# Patient Record
Sex: Female | Born: 1975 | Race: Black or African American | Hispanic: No | Marital: Married | Smoking: Former smoker
Health system: Southern US, Community
[De-identification: ages and names within clinical notes are randomized; demographics above are authoritative.]

## PROBLEM LIST (undated history)

## (undated) DIAGNOSIS — J189 Pneumonia, unspecified organism: Secondary | ICD-10-CM

## (undated) HISTORY — DX: Pneumonia, unspecified organism: J18.9

---

## 1998-01-04 ENCOUNTER — Emergency Department (HOSPITAL_COMMUNITY): Admission: EM | Admit: 1998-01-04 | Discharge: 1998-01-04 | Payer: Self-pay | Admitting: Emergency Medicine

## 1999-11-19 ENCOUNTER — Other Ambulatory Visit: Admission: RE | Admit: 1999-11-19 | Discharge: 1999-11-19 | Payer: Self-pay | Admitting: *Deleted

## 2000-01-21 ENCOUNTER — Inpatient Hospital Stay (HOSPITAL_COMMUNITY): Admission: AD | Admit: 2000-01-21 | Discharge: 2000-01-21 | Payer: Self-pay | Admitting: Obstetrics and Gynecology

## 2000-04-08 ENCOUNTER — Inpatient Hospital Stay (HOSPITAL_COMMUNITY): Admission: AD | Admit: 2000-04-08 | Discharge: 2000-04-11 | Payer: Self-pay | Admitting: Obstetrics and Gynecology

## 2000-11-16 ENCOUNTER — Encounter: Payer: Self-pay | Admitting: Emergency Medicine

## 2000-11-16 ENCOUNTER — Emergency Department (HOSPITAL_COMMUNITY): Admission: EM | Admit: 2000-11-16 | Discharge: 2000-11-16 | Payer: Self-pay | Admitting: Emergency Medicine

## 2005-12-16 ENCOUNTER — Ambulatory Visit (HOSPITAL_COMMUNITY): Admission: RE | Admit: 2005-12-16 | Discharge: 2005-12-16 | Payer: Self-pay | Admitting: Obstetrics & Gynecology

## 2005-12-24 ENCOUNTER — Inpatient Hospital Stay (HOSPITAL_COMMUNITY): Admission: AD | Admit: 2005-12-24 | Discharge: 2005-12-24 | Payer: Self-pay | Admitting: Obstetrics

## 2006-02-06 ENCOUNTER — Ambulatory Visit (HOSPITAL_COMMUNITY): Admission: RE | Admit: 2006-02-06 | Discharge: 2006-02-06 | Payer: Self-pay | Admitting: Obstetrics & Gynecology

## 2006-06-19 ENCOUNTER — Inpatient Hospital Stay (HOSPITAL_COMMUNITY): Admission: AD | Admit: 2006-06-19 | Discharge: 2006-06-22 | Payer: Self-pay | Admitting: Obstetrics & Gynecology

## 2010-03-12 ENCOUNTER — Emergency Department (HOSPITAL_COMMUNITY): Payer: No Typology Code available for payment source

## 2010-03-12 ENCOUNTER — Emergency Department (HOSPITAL_COMMUNITY)
Admission: EM | Admit: 2010-03-12 | Discharge: 2010-03-12 | Disposition: A | Discharge status: Home / Self Care | Payer: No Typology Code available for payment source | Attending: Emergency Medicine | Admitting: Emergency Medicine

## 2010-03-12 DIAGNOSIS — M25519 Pain in unspecified shoulder: Secondary | ICD-10-CM | POA: Insufficient documentation

## 2010-03-12 DIAGNOSIS — M25569 Pain in unspecified knee: Secondary | ICD-10-CM | POA: Insufficient documentation

## 2010-03-12 DIAGNOSIS — M542 Cervicalgia: Secondary | ICD-10-CM | POA: Insufficient documentation

## 2014-09-22 DIAGNOSIS — J189 Pneumonia, unspecified organism: Secondary | ICD-10-CM

## 2014-09-22 HISTORY — DX: Pneumonia, unspecified organism: J18.9

## 2014-10-10 ENCOUNTER — Emergency Department (HOSPITAL_COMMUNITY)
Admission: EM | Admit: 2014-10-10 | Discharge: 2014-10-10 | Disposition: A | Discharge status: Home / Self Care | Payer: BLUE CROSS/BLUE SHIELD | Attending: Emergency Medicine | Admitting: Emergency Medicine

## 2014-10-10 ENCOUNTER — Encounter (HOSPITAL_COMMUNITY): Payer: Self-pay | Admitting: Emergency Medicine

## 2014-10-10 ENCOUNTER — Emergency Department (HOSPITAL_COMMUNITY): Payer: BLUE CROSS/BLUE SHIELD

## 2014-10-10 DIAGNOSIS — Z87891 Personal history of nicotine dependence: Secondary | ICD-10-CM | POA: Insufficient documentation

## 2014-10-10 DIAGNOSIS — J159 Unspecified bacterial pneumonia: Secondary | ICD-10-CM | POA: Insufficient documentation

## 2014-10-10 DIAGNOSIS — J189 Pneumonia, unspecified organism: Secondary | ICD-10-CM

## 2014-10-10 DIAGNOSIS — Z3202 Encounter for pregnancy test, result negative: Secondary | ICD-10-CM | POA: Diagnosis not present

## 2014-10-10 DIAGNOSIS — R079 Chest pain, unspecified: Secondary | ICD-10-CM | POA: Diagnosis present

## 2014-10-10 LAB — BASIC METABOLIC PANEL
ANION GAP: 8 (ref 5–15)
BUN: 7 mg/dL (ref 6–20)
CALCIUM: 9.6 mg/dL (ref 8.9–10.3)
CO2: 25 mmol/L (ref 22–32)
CREATININE: 0.75 mg/dL (ref 0.44–1.00)
Chloride: 105 mmol/L (ref 101–111)
Glucose, Bld: 97 mg/dL (ref 65–99)
Potassium: 3.6 mmol/L (ref 3.5–5.1)
SODIUM: 138 mmol/L (ref 135–145)

## 2014-10-10 LAB — CBC
HCT: 39 % (ref 36.0–46.0)
HEMOGLOBIN: 13.5 g/dL (ref 12.0–15.0)
MCH: 30.5 pg (ref 26.0–34.0)
MCHC: 34.6 g/dL (ref 30.0–36.0)
MCV: 88.2 fL (ref 78.0–100.0)
PLATELETS: 348 10*3/uL (ref 150–400)
RBC: 4.42 MIL/uL (ref 3.87–5.11)
RDW: 12.5 % (ref 11.5–15.5)
WBC: 14.6 10*3/uL — ABNORMAL HIGH (ref 4.0–10.5)

## 2014-10-10 LAB — I-STAT BETA HCG BLOOD, ED (MC, WL, AP ONLY)

## 2014-10-10 LAB — I-STAT TROPONIN, ED: TROPONIN I, POC: 0 ng/mL (ref 0.00–0.08)

## 2014-10-10 LAB — TROPONIN I

## 2014-10-10 LAB — D-DIMER, QUANTITATIVE (NOT AT ARMC): D DIMER QUANT: 1.12 ug{FEU}/mL — AB (ref 0.00–0.48)

## 2014-10-10 MED ORDER — LEVOFLOXACIN 750 MG PO TABS
750.0000 mg | ORAL_TABLET | Freq: Once | ORAL | Status: AC
  Administered 2014-10-10: 750 mg via ORAL
  Filled 2014-10-10: qty 1

## 2014-10-10 MED ORDER — LEVOFLOXACIN 750 MG PO TABS: 750.0000 mg | ORAL_TABLET | Freq: Every day | ORAL | Status: AC

## 2014-10-10 MED ORDER — IOHEXOL 350 MG/ML SOLN
80.0000 mL | Freq: Once | INTRAVENOUS | Status: AC | PRN
  Administered 2014-10-10: 80 mL via INTRAVENOUS

## 2014-10-10 MED ORDER — KETOROLAC TROMETHAMINE 30 MG/ML IJ SOLN
30.0000 mg | Freq: Once | INTRAMUSCULAR | Status: AC
  Administered 2014-10-10: 30 mg via INTRAVENOUS
  Filled 2014-10-10: qty 1

## 2014-10-10 MED ORDER — SODIUM CHLORIDE 0.9 % IV BOLUS (SEPSIS)
1000.0000 mL | Freq: Once | INTRAVENOUS | Status: AC
  Administered 2014-10-10: 1000 mL via INTRAVENOUS

## 2014-10-10 NOTE — ED Notes (Signed)
Pt transporting to CT angio. NAD

## 2014-10-10 NOTE — ED Notes (Signed)
Patient states R chest pain and shortness of breath when she tries to breathe in.   Denies other symptoms.

## 2014-10-10 NOTE — ED Provider Notes (Signed)
CSN: [id number]     Arrival date & time 10/10/14  0906 History   First MD Initiated Contact with Patient 10/10/14 1037     Chief Complaint  Patient presents with  . Chest Pain  . Shortness of Breath     (Consider location/radiation/quality/duration/timing/severity/associated sxs/prior Treatment) HPI Comments: 39 y.o. Female with history of pneumonia presents for chest pain and shortness of breath.  The patient reports that since last night she has had sharp pain in her right chest that is worse with inspiration and seems to improve with expiration.  She denies fever, chills, nausea, vomiting.  She reports that about 2 weeks ago she did drive to and from [place].  Denies other immobility or surgery.  She is not on anticoagulation.  She reports that she did have similar symptoms with a previous episode of pneumonia.  Denies abdominal pain or pain with eating.    Patient is a 39 y.o. female presenting with chest pain and shortness of breath.  Chest Pain Associated symptoms: shortness of breath   Associated symptoms: no abdominal pain, no back pain, no cough, no dizziness, no fatigue, no fever, no headache, no nausea, no palpitations and not vomiting   Shortness of Breath Associated symptoms: chest pain   Associated symptoms: no abdominal pain, no cough, no fever, no headaches, no rash and no vomiting     History reviewed. No pertinent past medical history. History reviewed. No pertinent past surgical history. No family history on file. Social History  Substance Use Topics  . Smoking status: Former Games developer  . Smokeless tobacco: None  . Alcohol Use: Yes     Comment: socially   OB History    No data available     Review of Systems  Constitutional: Negative for fever, chills, appetite change and fatigue.  HENT: Negative for congestion, postnasal drip and rhinorrhea.   Eyes: Negative for pain and redness.  Respiratory: Positive for shortness of breath. Negative for cough and chest  tightness.   Cardiovascular: Positive for chest pain. Negative for palpitations and leg swelling.  Gastrointestinal: Negative for nausea, vomiting, abdominal pain and diarrhea.  Genitourinary: Negative for dysuria, urgency and hematuria.  Musculoskeletal: Negative for myalgias and back pain.  Skin: Negative for rash.  Neurological: Negative for dizziness, light-headedness and headaches.  Hematological: Does not bruise/bleed easily.      Allergies  Review of patient's allergies indicates no known allergies.  Home Medications   Prior to Admission medications   Medication Sig Start Date End Date Taking? Authorizing Provider  levofloxacin (LEVAQUIN) 750 MG tablet Take 1 tablet (750 mg total) by mouth daily. 10/10/14   Leta Baptist, MD   BP 129/84 mmHg  Pulse 99  Temp(Src) 98.4 F (36.9 C) (Oral)  Resp 23  Ht  (1.575 m)  Wt 205 lb 9 oz (93.243 kg)  BMI 37.59 kg/m2  SpO2 98%  LMP 09/30/2014 Physical Exam  Constitutional: She is oriented to person, place, and time. She appears well-developed and well-nourished. No distress.  HENT:  Head: Normocephalic and atraumatic.  Right Ear: External ear normal.  Left Ear: External ear normal.  Nose: Nose normal.  Mouth/Throat: Oropharynx is clear and moist. No oropharyngeal exudate.  Eyes: EOM are normal. Pupils are equal, round, and reactive to light.  Neck: Normal range of motion. Neck supple.  Cardiovascular: Normal rate, regular rhythm, normal heart sounds and intact distal pulses.   No murmur heard. Pulmonary/Chest: Effort normal. No respiratory distress. She has no wheezes. She  has no rales.  Abdominal: Soft. She exhibits no distension. There is no tenderness.  Musculoskeletal: Normal range of motion. She exhibits no edema or tenderness.  Neurological: She is alert and oriented to person, place, and time.  Skin: Skin is warm and dry. No rash noted. She is not diaphoretic.  Vitals reviewed.   ED Course  Procedures  (including critical care time) Labs Review Labs Reviewed  CBC - Abnormal; Notable for the following:    WBC 14.6 (*)    All other components within normal limits  D-DIMER, QUANTITATIVE (NOT AT [facility]) - Abnormal; Notable for the following:    D-Dimer, Quant 1.12 (*)    All other components within normal limits  BASIC METABOLIC PANEL  TROPONIN I  I-STAT BETA HCG BLOOD, ED ([facility], [facility], [facility] ONLY)  I-STAT TROPOININ, ED    Imaging Review Dg Chest 2 View  10/10/2014   CLINICAL DATA:  Right upper chest pain since yesterday. Low-grade fever, shortness of breath.  EXAM: CHEST  2 VIEW  COMPARISON:  None.  FINDINGS: There bibasilar opacities compatible with atelectasis. Small effusions. Heart is normal size. No acute bony abnormality.  IMPRESSION: Small bilateral effusions with bibasilar atelectasis.   Electronically Signed   By: Charlett Nose M.D.   On: 10/10/2014 10:45   Ct Angio Chest Pe W/cm &/or Wo Cm  10/10/2014   CLINICAL DATA:  Right-sided chest pain, shortness of breath since yesterday morning. Pain increases with deep inspiration.  EXAM: CT ANGIOGRAPHY CHEST WITH CONTRAST  TECHNIQUE: Multidetector CT imaging of the chest was performed using the standard protocol during bolus administration of intravenous contrast. Multiplanar CT image reconstructions and MIPs were obtained to evaluate the vascular anatomy.  CONTRAST:  80mL OMNIPAQUE IOHEXOL 350 MG/ML SOLN  COMPARISON:  10/10/2014  FINDINGS: No filling defects in the pulmonary arteries to suggest pulmonary emboli. There is a small right pleural effusion. Right lower lobe atelectasis or pneumonia noted. Less pronounced left basilar opacity could reflect atelectasis or pneumonia as well. No mediastinal, hilar, or axillary adenopathy. Heart is normal size. Aorta is normal caliber. Chest wall soft tissues are unremarkable. Imaging into the upper abdomen shows no acute findings.  Review of the MIP images confirms the above findings.  IMPRESSION: No evidence of  pulmonary embolus.  Bilateral lower lobe airspace opacities, right worse than left which could reflect atelectasis or pneumonia.  Small right pleural effusion.   Electronically Signed   By: Charlett Nose M.D.   On: 10/10/2014 14:28   I have personally reviewed and evaluated these images and lab results as part of my medical decision-making.   EKG Interpretation   Date/Time:  Monday October 10 2014 10:29:47 EDT Ventricular Rate:  91 PR Interval:  140 QRS Duration: 82 QT Interval:  338 QTC Calculation: 415 R Axis:   86 Text Interpretation:  Normal sinus rhythm Normal ECG No previous tracing  Confirmed by Marlys Stegmaier ([id number]) on 10/10/2014 1:55:13 PM      MDM  Patient was seen and evaluated in stable condition.  Concern for PE.  Elevated D dimer and leukocytosis noted but otherwise unremarkable laboratory studies.  Chest xray with bibasilar effusion.  CT angio without PE but with lower lobe airspace opacities.  With symptoms and leukocytosis appeared most consistent with pneumonia.  Patient started on Levaquin.  Results, clinical impression, and plan of care discussed with patient who expressed understanding and agreement.  Patient was discharged home in stable condition with instruction to follow up with her PCP.  Final diagnoses:  CAP (community acquired pneumonia)    1. Community Acquired Pneumonia    Leta Baptist, MD 10/10/14 2252

## 2014-10-10 NOTE — Discharge Instructions (Signed)

## 2014-10-14 ENCOUNTER — Ambulatory Visit: Payer: BLUE CROSS/BLUE SHIELD | Attending: Physician Assistant | Admitting: Physician Assistant

## 2014-10-14 VITALS — BP 106/77 | HR 109 | Temp 98.5°F | Resp 18 | Ht 62.0 in | Wt 207.0 lb

## 2014-10-14 DIAGNOSIS — J189 Pneumonia, unspecified organism: Secondary | ICD-10-CM | POA: Diagnosis present

## 2014-10-14 NOTE — Progress Notes (Signed)
   Rebecca Baldwin  ZOX:[id number]  [id number]  DOB - 1975/02/19  Chief Complaint  Patient presents with  . Hospitalization Follow-up       Subjective:   Rebecca Baldwin is a 39 y.o. female here today for establishment of care. She was in the emergency department on 10/10/2014 with complaints of right-sided chest pain, shortness of breath, and difficulty taking a deep breath. Her sensation was sharp. No accompanying fever, chills nausea, or vomiting. It is been going on for a couple of days. She states she felt like this prior when having a bout with pneumonia. Her white blood cell count was 14,000. A CTPA was negative for pulmonary emboli but did show some opacities in the right lower lobe. She was started on Levaquin.  Since discharge she's feeling a little better. Her cough is lessened. She still quite sore on the right side especially when she takes a deep breath. Her sputum has cleared. She is able to sleep.    ROS: GEN: denies fever or chills, denies change in weight Skin: denies lesions or rashes HEENT: denies headache, earache, epistaxis, sore throat, or neck pain LUNGS: + SHOB, +dyspnea, PND, orthopnea CV: denies CP or palpitations   Problem  Cap (Community Acquired Pneumonia)    ALLERGIES: No Known Allergies  PAST MEDICAL HISTORY: Past Medical History  Diagnosis Date  . Pneumonia 09/2014    PAST SURGICAL HISTORY: Past Surgical History  Procedure Laterality Date  . Cesarean section      MEDICATIONS AT HOME: Prior to Admission medications   Medication Sig Start Date End Date Taking? Authorizing Provider  levofloxacin (LEVAQUIN) 750 MG tablet Take 1 tablet (750 mg total) by mouth daily. 10/10/14   Leta Baptist, MD     Objective:   Filed Vitals:   10/14/14 1014  BP: 106/77  Pulse: 109  Temp: 98.5 F (36.9 C)  TempSrc: Oral  Resp: 18  Height:  (1.575 m)  Weight: 207 lb (93.895 kg)  SpO2: 98%    Exam General appearance : Awake,  alert, not in any distress. Speech Clear. Not toxic looking HEENT: Atraumatic and Normocephalic, pupils equally reactive to light and accomodation Neck: supple, no JVD. No cervical lymphadenopathy.  Chest:Good air entry bilaterally, no added sounds  CVS: S1 S2 regular, no murmurs.    Assessment & Plan  1. Community-acquired pneumonia  -Complete the full 7 day course of Levaquin  -If you have not had total resolution of his symptoms over the next 10 days please give [place] a call  back    Return in about 3 months (around 01/13/2015). Routine health maintenance, gynecological exam etc.  The patient was given clear instructions to go to ER or return to medical center if symptoms don't improve, worsen or new problems develop. The patient verbalized understanding. The patient was told to call to get lab results if they haven't heard anything in the next week.   This note has been created with Education officer, environmental. Any transcriptional errors are unintentional.    Scot Jun, PA-C [facility] [place], [place] [phone]   10/14/2014, 11:21 AM

## 2014-10-14 NOTE — Progress Notes (Signed)
Patient in ED with pneumonia on Monday. Patient reports feeling much better, breathing is improving, patient still has pain in right rib area in the mornings with breathing. Patient reports sneezing a lot yesterday and took Benadryl.   Patient reports pain in right rib area currently at level 3, described as discomfort, with breathing.

## 2014-10-19 ENCOUNTER — Telehealth: Payer: Self-pay | Admitting: Physician Assistant

## 2014-10-19 NOTE — Telephone Encounter (Signed)
Patient called to request some advice on what to take for her cough. Please f/u with pt.

## 2014-11-04 NOTE — Telephone Encounter (Signed)
Nurse called patient, patient verified date of birth. Patient reports feeling well and has no needs at this time.

## 2015-06-10 ENCOUNTER — Encounter (HOSPITAL_COMMUNITY): Payer: Self-pay

## 2015-06-10 ENCOUNTER — Emergency Department (HOSPITAL_COMMUNITY)
Admission: EM | Admit: 2015-06-10 | Discharge: 2015-06-10 | Disposition: A | Discharge status: Home / Self Care | Payer: BLUE CROSS/BLUE SHIELD | Attending: Emergency Medicine | Admitting: Emergency Medicine

## 2015-06-10 DIAGNOSIS — Y92410 Unspecified street and highway as the place of occurrence of the external cause: Secondary | ICD-10-CM | POA: Insufficient documentation

## 2015-06-10 DIAGNOSIS — S4991XA Unspecified injury of right shoulder and upper arm, initial encounter: Secondary | ICD-10-CM | POA: Insufficient documentation

## 2015-06-10 DIAGNOSIS — Y9389 Activity, other specified: Secondary | ICD-10-CM | POA: Insufficient documentation

## 2015-06-10 DIAGNOSIS — Y998 Other external cause status: Secondary | ICD-10-CM | POA: Diagnosis not present

## 2015-06-10 DIAGNOSIS — S3992XA Unspecified injury of lower back, initial encounter: Secondary | ICD-10-CM | POA: Insufficient documentation

## 2015-06-10 DIAGNOSIS — Z79899 Other long term (current) drug therapy: Secondary | ICD-10-CM | POA: Diagnosis not present

## 2015-06-10 DIAGNOSIS — Z87891 Personal history of nicotine dependence: Secondary | ICD-10-CM | POA: Diagnosis not present

## 2015-06-10 DIAGNOSIS — Z8701 Personal history of pneumonia (recurrent): Secondary | ICD-10-CM | POA: Insufficient documentation

## 2015-06-10 MED ORDER — NAPROXEN 500 MG PO TABS: 500.0000 mg | ORAL_TABLET | Freq: Two times a day (BID) | ORAL | Status: AC

## 2015-06-10 MED ORDER — DIAZEPAM 5 MG PO TABS: 5.0000 mg | ORAL_TABLET | Freq: Two times a day (BID) | ORAL | Status: AC

## 2015-06-10 MED ORDER — NAPROXEN 250 MG PO TABS
500.0000 mg | ORAL_TABLET | Freq: Once | ORAL | Status: AC
  Administered 2015-06-10: 500 mg via ORAL
  Filled 2015-06-10: qty 2

## 2015-06-10 MED ORDER — DIAZEPAM 5 MG PO TABS
5.0000 mg | ORAL_TABLET | Freq: Once | ORAL | Status: AC
  Administered 2015-06-10: 5 mg via ORAL
  Filled 2015-06-10: qty 1
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
## 2015-06-10 NOTE — ED Notes (Signed)
Declined W/C at D/C and was escorted to lobby by RN. 
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
## 2015-08-05 ENCOUNTER — Emergency Department (HOSPITAL_COMMUNITY)
Admission: EM | Admit: 2015-08-05 | Discharge: 2015-08-05 | Disposition: A | Discharge status: Home / Self Care | Payer: No Typology Code available for payment source | Attending: Emergency Medicine | Admitting: Emergency Medicine

## 2015-08-05 ENCOUNTER — Encounter (HOSPITAL_COMMUNITY): Payer: Self-pay

## 2015-08-05 DIAGNOSIS — Z87891 Personal history of nicotine dependence: Secondary | ICD-10-CM | POA: Diagnosis not present

## 2015-08-05 DIAGNOSIS — Y939 Activity, unspecified: Secondary | ICD-10-CM | POA: Insufficient documentation

## 2015-08-05 DIAGNOSIS — Y92410 Unspecified street and highway as the place of occurrence of the external cause: Secondary | ICD-10-CM | POA: Insufficient documentation

## 2015-08-05 DIAGNOSIS — Y999 Unspecified external cause status: Secondary | ICD-10-CM | POA: Diagnosis not present

## 2015-08-05 DIAGNOSIS — M549 Dorsalgia, unspecified: Secondary | ICD-10-CM | POA: Insufficient documentation

## 2015-08-05 LAB — URINALYSIS, ROUTINE W REFLEX MICROSCOPIC
Bilirubin Urine: NEGATIVE
Glucose, UA: NEGATIVE mg/dL
Hgb urine dipstick: NEGATIVE
KETONES UR: NEGATIVE mg/dL
LEUKOCYTES UA: NEGATIVE
NITRITE: NEGATIVE
PROTEIN: NEGATIVE mg/dL
Specific Gravity, Urine: 1.016 (ref 1.005–1.030)
pH: 6 (ref 5.0–8.0)

## 2015-08-05 LAB — PREGNANCY, URINE: Preg Test, Ur: NEGATIVE

## 2015-08-05 MED ORDER — KETOROLAC TROMETHAMINE 60 MG/2ML IM SOLN
60.0000 mg | Freq: Once | INTRAMUSCULAR | Status: AC
  Administered 2015-08-05: 60 mg via INTRAMUSCULAR
  Filled 2015-08-05: qty 2

## 2015-08-05 MED ORDER — METOCLOPRAMIDE HCL 10 MG PO TABS
10.0000 mg | ORAL_TABLET | Freq: Once | ORAL | Status: AC
  Administered 2015-08-05: 10 mg via ORAL
  Filled 2015-08-05: qty 1
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
## 2016-08-26 IMAGING — CT CT ANGIO CHEST
1 of 8 series · 17 of 36 positions shown · IV contrast (Iohexol (Omnipaque 350))
Comparison: 10/10/2014

CLINICAL DATA: Right-sided chest pain, shortness of breath since
yesterday morning. Pain increases with deep inspiration.

EXAM:
CT ANGIOGRAPHY CHEST WITH CONTRAST
TECHNIQUE: Multidetector CT imaging of the chest was performed using the
standard protocol during bolus administration of intravenous
contrast. Multiplanar CT image reconstructions and MIPs were
obtained to evaluate the vascular anatomy.
CONTRAST:  80mL OMNIPAQUE IOHEXOL 350 MG/ML SOLN

[Series 406: thins pacs · axial · 0.63mm/px · z∈[+92,+306]mm · 17 of 242 slices shown]
[im 14/242  lung]
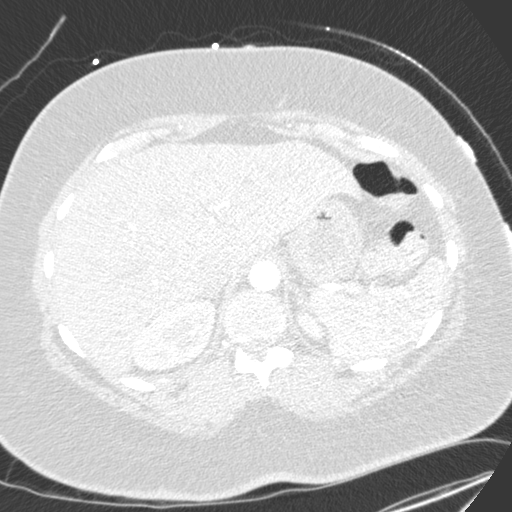
[im 27/242  mediastinal]
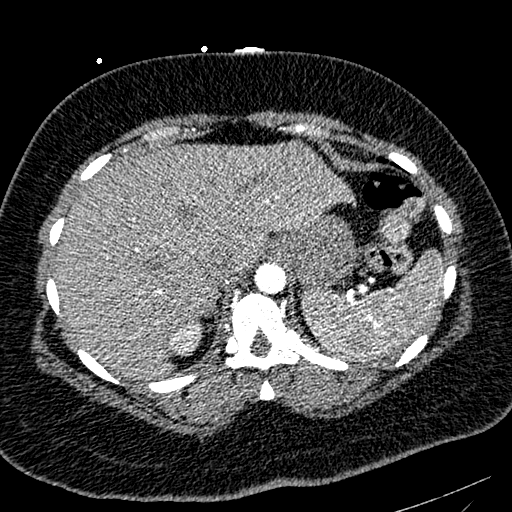
[im 41/242  lung]
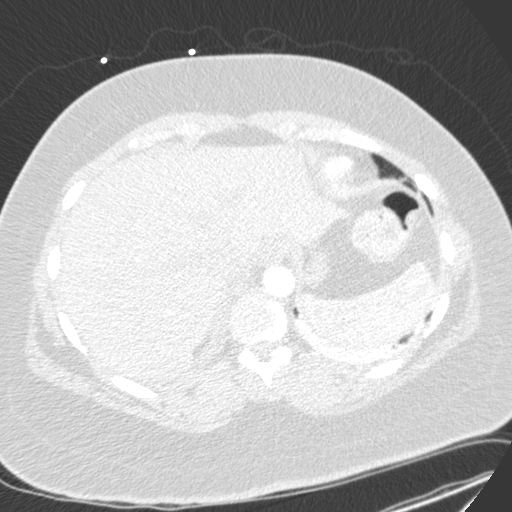
[im 54/242  mediastinal]
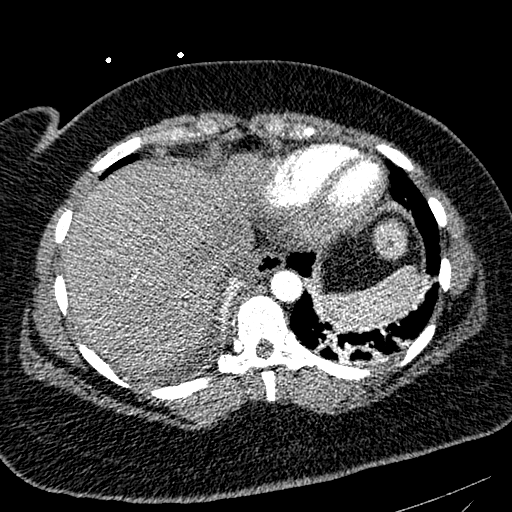
[im 67/242  lung]
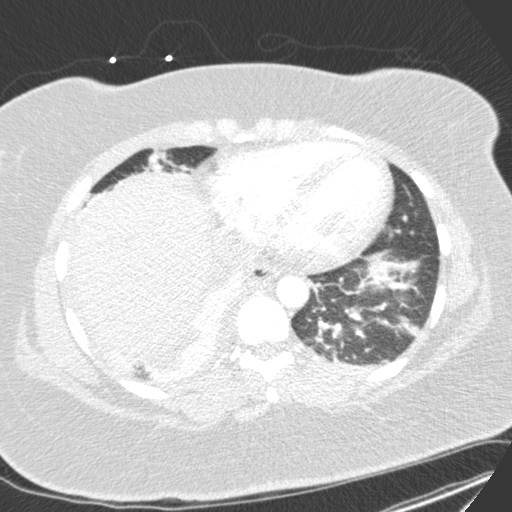
[im 81/242  mediastinal]
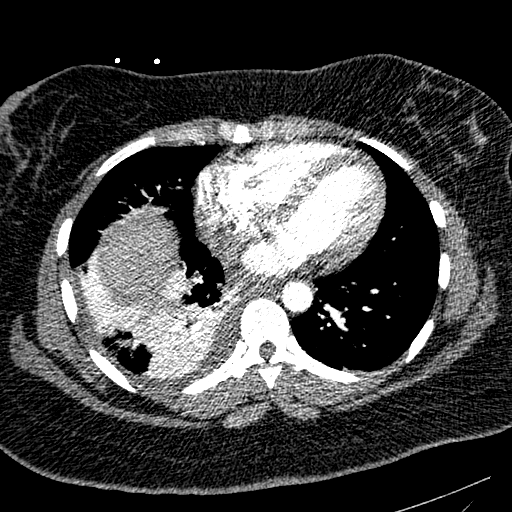
[im 94/242  lung]
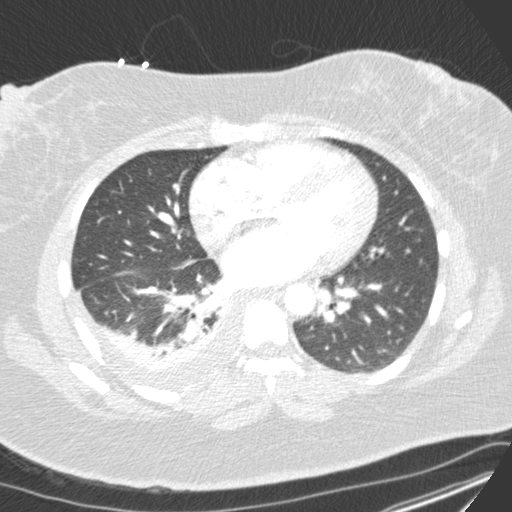
[im 108/242  mediastinal]
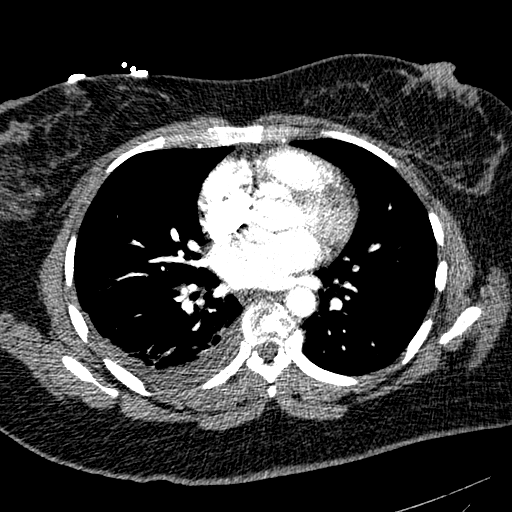
[im 121/242  lung]
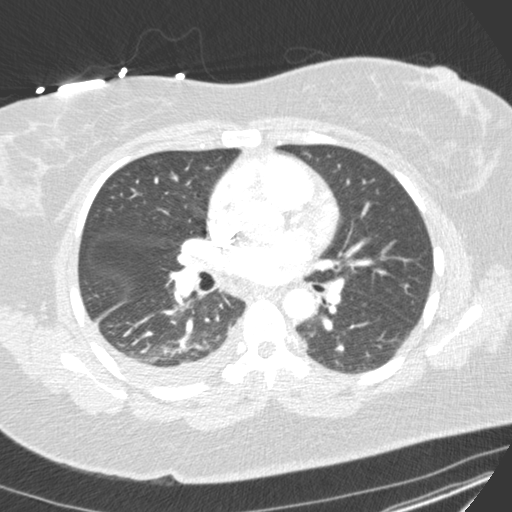
[im 134/242  mediastinal]
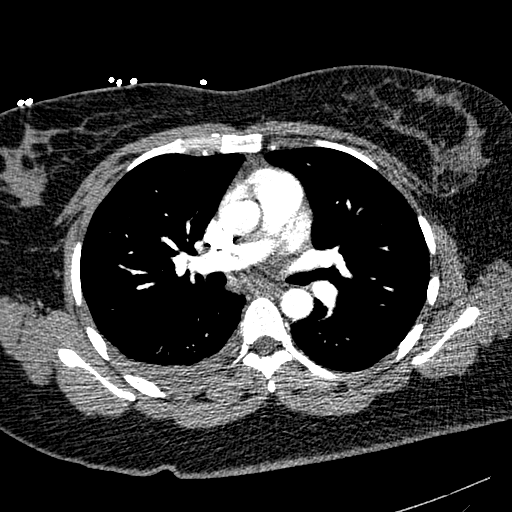
[im 148/242  lung]
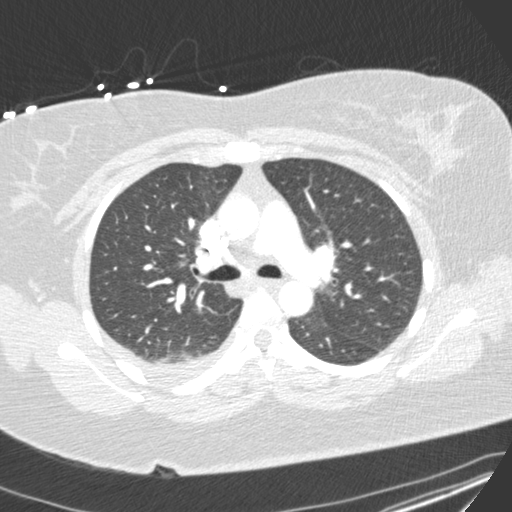
[im 161/242  mediastinal]
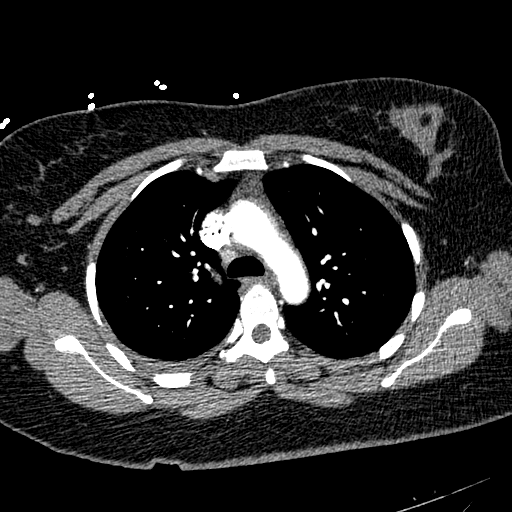
[im 175/242  lung]
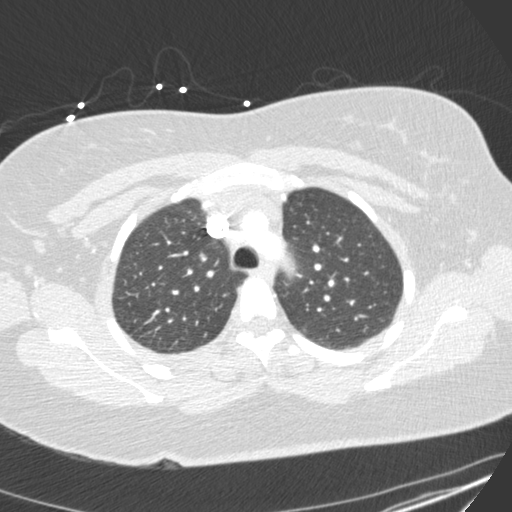
[im 188/242  mediastinal]
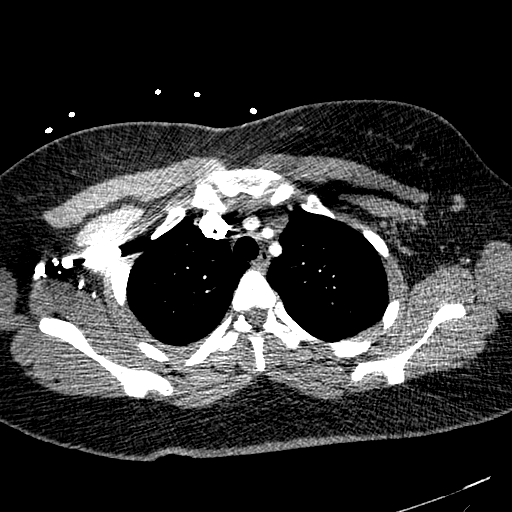
[im 201/242  lung]
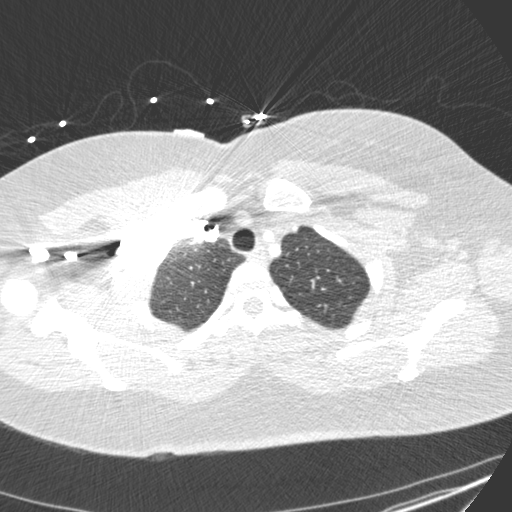
[im 215/242  mediastinal]
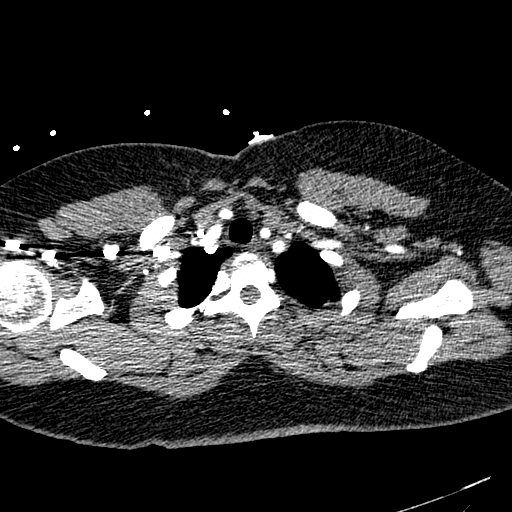
[im 228/242  lung]
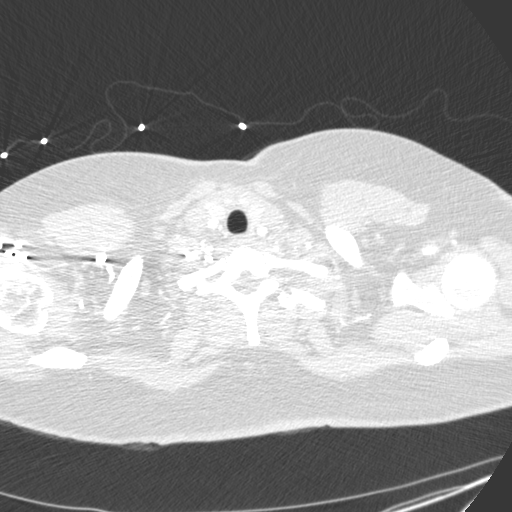

[17 of 36 positions shown; findings below may reference images not displayed]

FINDINGS: No filling defects in the pulmonary arteries to suggest pulmonary
emboli. There is a small right pleural effusion. Right lower lobe
atelectasis or pneumonia noted. Less pronounced left basilar opacity
could reflect atelectasis or pneumonia as well. No mediastinal,
hilar, or axillary adenopathy. Heart is normal size. Aorta is normal
caliber. Chest wall soft tissues are unremarkable. Imaging into the
upper abdomen shows no acute findings.

Review of the MIP images confirms the above findings.
IMPRESSION: No evidence of pulmonary embolus.

Bilateral lower lobe airspace opacities, right worse than left which
could reflect atelectasis or pneumonia.

Small right pleural effusion.

## 2016-08-26 IMAGING — CR DG CHEST 2V
2 series · 2 of 2 positions shown · non-contrast
Comparison: None.

CLINICAL DATA: Right upper chest pain since yesterday. Low-grade
fever, shortness of breath.

EXAM:
CHEST  2 VIEW

[chest pa]
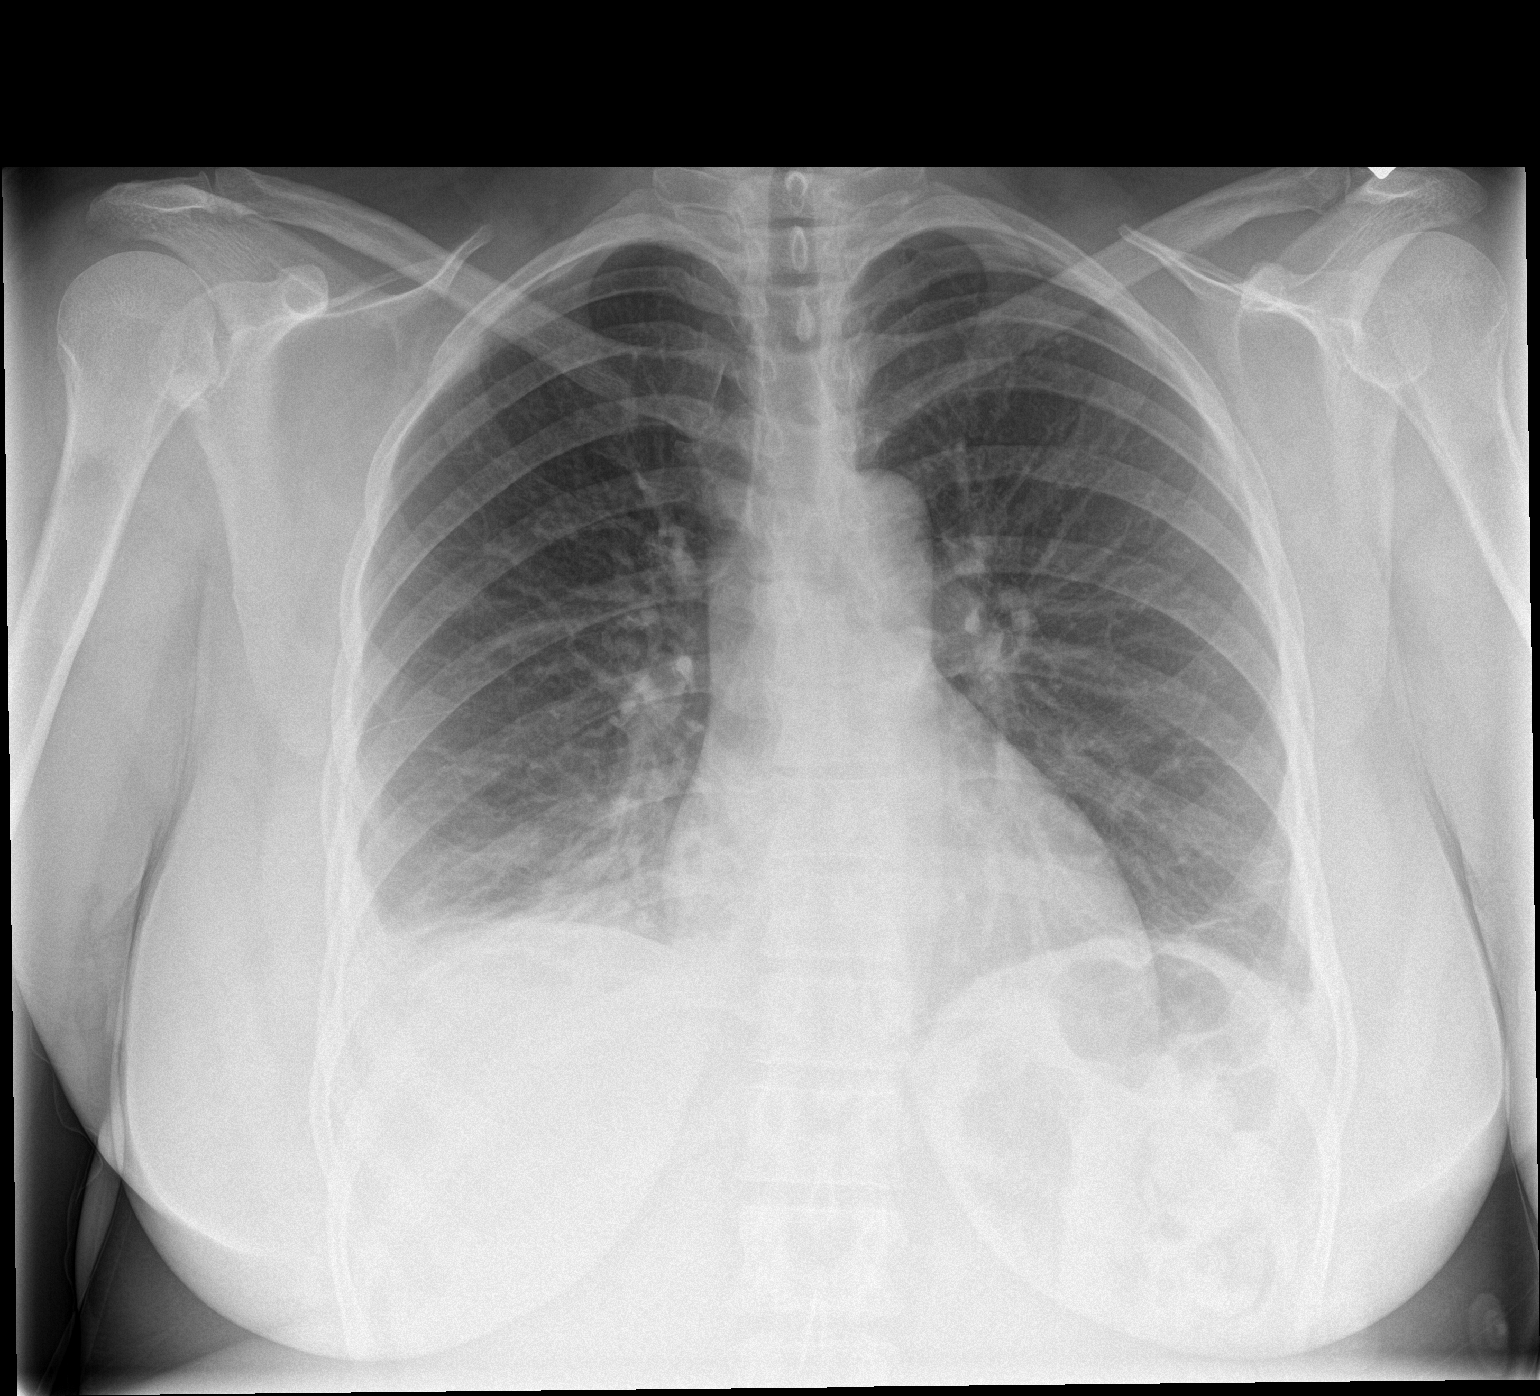

[chest lat]
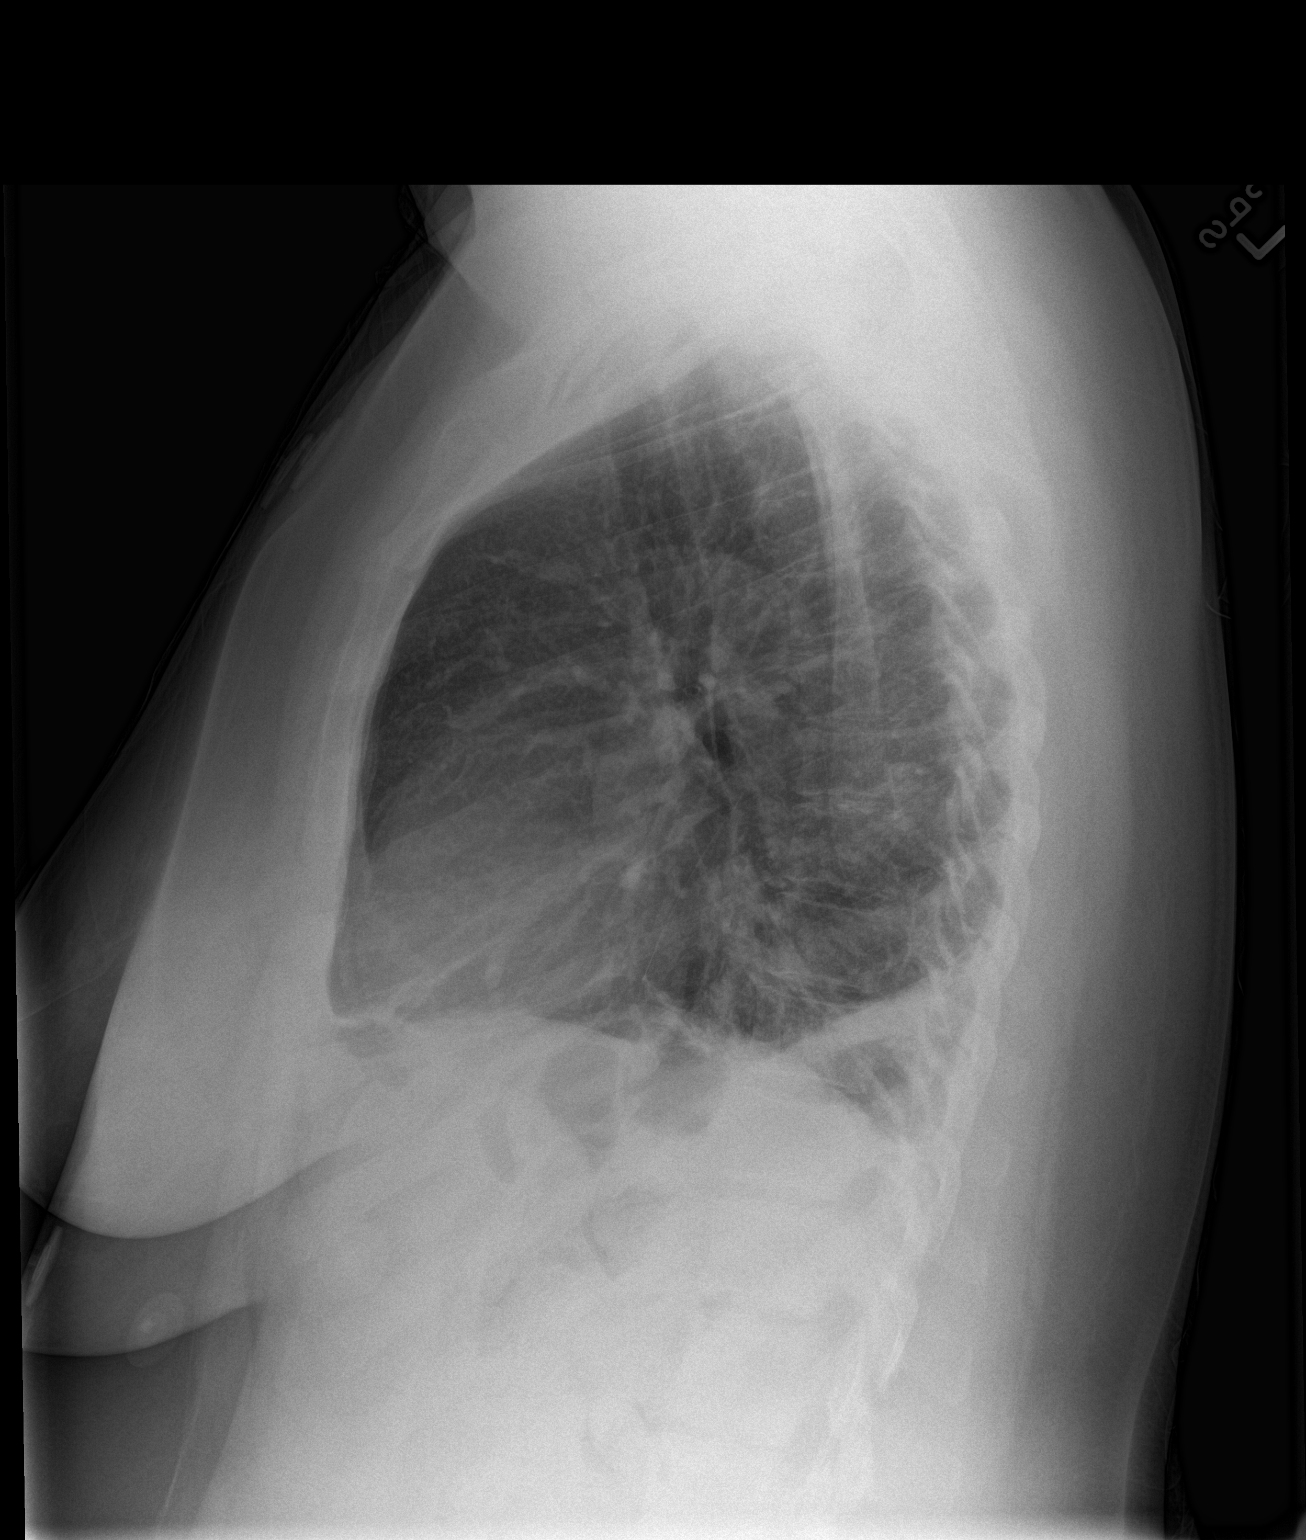

[2 of 2 positions shown; findings below may reference images not displayed]

FINDINGS: There bibasilar opacities compatible with atelectasis. Small
effusions. Heart is normal size. No acute bony abnormality.
IMPRESSION: Small bilateral effusions with bibasilar atelectasis.
